# Patient Record
Sex: Female | Born: 1955 | Race: Black or African American | Hispanic: No | Marital: Married | Smoking: Never smoker
Health system: Southern US, Community
[De-identification: ages and names within clinical notes are randomized; demographics above are authoritative.]

## PROBLEM LIST (undated history)

## (undated) HISTORY — PX: TUBAL LIGATION: SHX77

---

## 2004-12-07 ENCOUNTER — Ambulatory Visit: Payer: Self-pay | Admitting: Unknown Physician Specialty

## 2005-03-09 ENCOUNTER — Ambulatory Visit: Payer: Self-pay | Admitting: Unknown Physician Specialty

## 2008-09-25 ENCOUNTER — Ambulatory Visit: Payer: Self-pay

## 2009-10-23 ENCOUNTER — Ambulatory Visit: Payer: Self-pay | Admitting: Family Medicine

## 2011-10-21 ENCOUNTER — Ambulatory Visit: Payer: Self-pay | Admitting: Family Medicine

## 2015-04-16 ENCOUNTER — Other Ambulatory Visit: Payer: Self-pay | Admitting: Family Medicine

## 2015-04-16 DIAGNOSIS — Z0000 Encounter for general adult medical examination without abnormal findings: Secondary | ICD-10-CM

## 2016-07-14 ENCOUNTER — Ambulatory Visit: Payer: Self-pay | Attending: Internal Medicine

## 2016-07-14 ENCOUNTER — Ambulatory Visit
Admission: RE | Admit: 2016-07-14 | Discharge: 2016-07-14 | Disposition: A | Discharge status: Home / Self Care | Payer: Self-pay | Source: Ambulatory Visit | Attending: Oncology | Admitting: Oncology

## 2016-07-14 VITALS — BP 134/73 | HR 75 | Temp 98.2°F | Ht 67.72 in | Wt 155.3 lb

## 2016-07-14 DIAGNOSIS — Z0000 Encounter for general adult medical examination without abnormal findings: Secondary | ICD-10-CM

## 2016-07-14 NOTE — Progress Notes (Signed)
Subjective:     Patient ID: Colleen Duarte, female   DOB: 1956-02-20, 60 y.o.   MRN: [id number]  HPI   Review of Systems     Objective:   Physical Exam  Pulmonary/Chest: Right breast exhibits no inverted nipple, no mass, no nipple discharge, no skin change and no tenderness. Left breast exhibits no inverted nipple, no mass, no nipple discharge, no skin change and no tenderness. Breasts are symmetrical.       Assessment:     60 [facility]  Patient screened, and meets [facility] eligibility.  Patient does not have insurance, Medicare or Medicaid.  Handout given on Affordable Care Act.  CBE unremarkable    Plan:     Sent for bilateral screening mammogram.

## 2016-07-28 NOTE — Progress Notes (Signed)
Letter mailed from [facility] to notify of normal mammogram results.  Patient to return in one year for annual screening.  Copy to [facility]. 

## 2019-01-05 ENCOUNTER — Encounter: Payer: Self-pay | Admitting: Podiatry

## 2019-01-05 ENCOUNTER — Ambulatory Visit (INDEPENDENT_AMBULATORY_CARE_PROVIDER_SITE_OTHER): Payer: Self-pay | Admitting: Podiatry

## 2019-01-05 ENCOUNTER — Ambulatory Visit (INDEPENDENT_AMBULATORY_CARE_PROVIDER_SITE_OTHER): Payer: Self-pay

## 2019-01-05 VITALS — BP 135/88 | HR 86

## 2019-01-05 DIAGNOSIS — M722 Plantar fascial fibromatosis: Secondary | ICD-10-CM

## 2019-01-05 MED ORDER — METHYLPREDNISOLONE 4 MG PO TBPK: ORAL_TABLET | ORAL | 0 refills | Status: AC

## 2019-01-08 ENCOUNTER — Ambulatory Visit: Payer: Self-pay

## 2019-01-08 NOTE — Progress Notes (Signed)
   Subjective: 63 year old female presenting today as a new patient with a chief complaint of intermittent pain located on the plantar left heel that began 3-4 months ago. She describes the pain as "nagging" and states it began gradually. Walking or standing for long periods of time increases the pain. She has been taking Naproxen for pain. Patient is here for further evaluation and treatment.  History reviewed. No pertinent past medical history.   Objective: Physical Exam General: The patient is alert and oriented x3 in no acute distress.  Dermatology: Skin is warm, dry and supple bilateral lower extremities. Negative for open lesions or macerations bilateral.   Vascular: Dorsalis Pedis and Posterior Tibial pulses palpable bilateral.  Capillary fill time is immediate to all digits.  Neurological: Epicritic and protective threshold intact bilateral.   Musculoskeletal: Tenderness to palpation to the plantar aspect of the left heel along the plantar fascia. All other joints range of motion within normal limits bilateral. Strength 5/5 in all groups bilateral.   Radiographic exam:   Normal osseous mineralization. Joint spaces preserved. No fracture/dislocation/boney destruction. No other soft tissue abnormalities or radiopaque foreign bodies.   Assessment: 1. Plantar fasciitis left foot  Plan of Care:  1. Patient evaluated. Xrays reviewed.   2. Injection of 0.5cc Celestone soluspan injected into the left plantar fascia.  3. Rx for Medrol Dose Pak placed. Then continue taking Naproxen.  4. Plantar fascial band(s) dispensed  5. Continue wearing New [employer] shoes.  6. Instructed patient regarding therapies and modalities at home to alleviate symptoms.  7. Return to clinic in 4 weeks.     Felecia Shelling, DPM [facility]  Dr. Felecia Shelling, DPM    [address] [place], [place] [address]                Office [phone]  Fax  [phone]

## 2019-01-30 ENCOUNTER — Ambulatory Visit: Payer: Self-pay | Admitting: Podiatry

## 2019-03-23 ENCOUNTER — Telehealth: Payer: Self-pay | Admitting: Podiatry

## 2019-03-23 ENCOUNTER — Telehealth: Payer: Self-pay | Admitting: *Deleted

## 2019-03-23 NOTE — Telephone Encounter (Signed)
I only saw her once for plantar fasciitis over 2 months ago. No pain meds will be sent in until she comes in for follow up visit. - Dr. Logan Bores

## 2019-03-23 NOTE — Telephone Encounter (Signed)
Pt's phone number showed on my call screen but she did not leave a message.

## 2019-03-23 NOTE — Telephone Encounter (Signed)
I called Colleen Duarte and she states she would like Dr. Logan Bores to call in a pain medication, he had offered but she now would like one because the naproxen is hurting her stomach.

## 2019-03-23 NOTE — Telephone Encounter (Signed)
Pain in feet and would like pain medication called in when possible

## 2019-03-26 NOTE — Telephone Encounter (Signed)
Left message informing pt of Dr. Logan Bores recommendations.

## 2019-04-25 ENCOUNTER — Ambulatory Visit: Payer: Self-pay

## 2021-04-18 ENCOUNTER — Emergency Department
Admission: EM | Admit: 2021-04-18 | Discharge: 2021-04-18 | Disposition: A | Discharge status: Home / Self Care | Payer: Self-pay | Attending: Emergency Medicine | Admitting: Emergency Medicine

## 2021-04-18 ENCOUNTER — Emergency Department: Payer: Self-pay

## 2021-04-18 ENCOUNTER — Encounter: Payer: Self-pay | Admitting: Emergency Medicine

## 2021-04-18 ENCOUNTER — Other Ambulatory Visit: Payer: Self-pay

## 2021-04-18 DIAGNOSIS — W19XXXA Unspecified fall, initial encounter: Secondary | ICD-10-CM

## 2021-04-18 DIAGNOSIS — W010XXA Fall on same level from slipping, tripping and stumbling without subsequent striking against object, initial encounter: Secondary | ICD-10-CM | POA: Insufficient documentation

## 2021-04-18 DIAGNOSIS — S42201A Unspecified fracture of upper end of right humerus, initial encounter for closed fracture: Secondary | ICD-10-CM | POA: Insufficient documentation

## 2021-04-18 DIAGNOSIS — Y92254 Theater (live) as the place of occurrence of the external cause: Secondary | ICD-10-CM | POA: Insufficient documentation

## 2021-04-18 MED ORDER — HYDROCODONE-ACETAMINOPHEN 5-325 MG PO TABS
1.0000 | ORAL_TABLET | ORAL | Status: AC
  Administered 2021-04-18: 1 via ORAL
  Filled 2021-04-18: qty 1

## 2021-04-18 MED ORDER — HYDROCODONE-ACETAMINOPHEN 5-325 MG PO TABS: 1.0000 | ORAL_TABLET | Freq: Four times a day (QID) | ORAL | 0 refills | Status: AC | PRN

## 2021-04-18 NOTE — ED Triage Notes (Signed)
Pt via POV from home. Pt had a fall this evening. Pt landed on her R shoulder. Pt is A&Ox4 and NAD. Denies head injury. Denies LOC. Denies blood thinners. Pt is A&Ox4 and NAD.

## 2021-04-18 NOTE — ED Provider Notes (Signed)
[facility] EMERGENCY [facility]PARTMENT Provider Note   CSN: [id number] Arrival date & time: 04/18/21  1832     History Chief Complaint  Patient presents with  . Fall    Colleen Duarte is a 65 y.o. female presents to the emergency department evaluation of a fall that occurred around 5 PM today.  Patient states she was in the movie theater and tripped over her feet in a dark theater.  She fell onto the right shoulder and denies hitting her head or losing consciousness.  No neck pain numbness tingling radicular symptoms.  No elbow discomfort.  She denies any lower extremity injury/pain.  She has not any medications for pain pain is moderate to severe and she points to the proximal humerus.  HPI     No past medical history on file.  There are no problems to display for this patient.   Past Surgical History:  Procedure Laterality Date  . TUBAL LIGATION       OB History   No obstetric history on file.     Family History  Problem Relation Age of Onset  . Breast cancer Sister 37       1/2 sister mother's side  . Breast cancer Paternal Aunt     Social History   Tobacco Use  . Smoking status: Never Smoker  . Smokeless tobacco: Never Used    Home Medications Prior to Admission medications   Medication Sig Start Date End Date Taking? Authorizing Provider  HYDROcodone-acetaminophen (NORCO) 5-325 MG tablet Take 1 tablet by mouth every 6 (six) hours as needed for moderate pain. 04/18/21  Yes Evon Slack, PA-C  methylPREDNISolone (MEDROL DOSEPAK) 4 MG TBPK tablet 6 day dose pack - take as directed 01/05/19   Felecia Shelling, DPM    Allergies    Patient has no known allergies.  Review of Systems   Review of Systems  Constitutional: Negative for activity change.  Eyes: Negative for pain and visual disturbance.  Respiratory: Negative for shortness of breath.   Cardiovascular: Negative for chest pain and leg swelling.  Gastrointestinal: Negative for  abdominal pain.  Genitourinary: Negative for flank pain and pelvic pain.  Musculoskeletal: Positive for arthralgias. Negative for gait problem, joint swelling, myalgias, neck pain and neck stiffness.  Skin: Negative for wound.  Neurological: Negative for dizziness, syncope, weakness, light-headedness, numbness and headaches.  Psychiatric/Behavioral: Negative for confusion and decreased concentration.    Physical Exam Updated Vital Signs BP (!) 160/60 (BP Location: Left Arm)   Pulse 69   Temp 98 F (36.7 C)   Resp 20   Ht 5\' 6"  (1.676 m)   Wt 64.9 kg   LMP 07/14/2006 (Approximate)   SpO2 100%   BMI 23.08 kg/m   Physical Exam Constitutional:      Appearance: She is well-developed.  HENT:     Head: Normocephalic and atraumatic.     Right Ear: External ear normal.     Left Ear: External ear normal.     Nose: Nose normal.  Eyes:     Extraocular Movements: Extraocular movements intact.     Conjunctiva/sclera: Conjunctivae normal.     Pupils: Pupils are equal, round, and reactive to light.  Cardiovascular:     Rate and Rhythm: Normal rate.  Pulmonary:     Effort: Pulmonary effort is normal. No respiratory distress.     Breath sounds: Normal breath sounds.  Abdominal:     Palpations: Abdomen is soft.  Tenderness: There is no abdominal tenderness.  Musculoskeletal:        General: Tenderness present. No deformity. Normal range of motion.     Cervical back: Normal range of motion.     Comments: Positive tenderness to the right proximal humerus.  No cervical spinous process tenderness or paravertebral muscle tenderness.  She has full active range of motion of cervical spine with no discomfort.  She has no tenderness throughout the clavicles, sternum.  She has no tenderness along the hips knees or ankles.  Skin:    General: Skin is warm and dry.     Findings: No rash.  Neurological:     Mental Status: She is alert and oriented to person, place, and time.     Cranial Nerves:  No cranial nerve deficit.     Coordination: Coordination normal.  Psychiatric:        Mood and Affect: Mood normal.        Behavior: Behavior normal.        Thought Content: Thought content normal.     ED Results / Procedures / Treatments   Labs (all labs ordered are listed, but only abnormal results are displayed) Labs Reviewed - No data to display  EKG None  Radiology DG Shoulder Right  Result Date: 04/18/2021 CLINICAL DATA:  Recent fall with right shoulder pain, initial encounter EXAM: RIGHT SHOULDER - 2+ VIEW COMPARISON:  None. FINDINGS: Degenerative changes of the acromioclavicular joint are noted. There is a fracture through the surgical neck with impaction at the fracture site. Mild inferior subluxation of the humeral head is noted. Underlying bony thorax appears within normal limits. IMPRESSION: Surgical neck fracture in the right proximal humerus with impaction at the fracture site. Electronically Signed   By: Alcide Clever M.D.   On: 04/18/2021 19:08    Procedures Procedures   Medications Ordered in ED Medications  HYDROcodone-acetaminophen (NORCO/VICODIN) 5-325 MG per tablet 1 tablet (has no administration in time range)    ED Course  I have reviewed the triage vital signs and the nursing notes.  Pertinent labs & imaging results that were available during my care of the patient were reviewed by me and considered in my medical decision making (see chart for details).    MDM Rules/Calculators/A&P                          65 year old female with right proximal humerus fracture.  No other injury to her body.  Vital signs are stable.  She is ambulatory.  Patient placed into a shoulder immobilizer and given ice.  She will be prescribed Norco for pain.  She will follow-up with orthopedics in 3 days.  She understands signs symptoms return to the ER for. Final Clinical Impression(s) / ED Diagnoses Final diagnoses:  Fall, initial encounter  Traumatic closed displaced  fracture of proximal end of right humerus, initial encounter    Rx / DC Orders ED Discharge Orders         Ordered    HYDROcodone-acetaminophen (NORCO) 5-325 MG tablet  Every 6 hours PRN        04/18/21 2018           Ronnette Juniper 04/18/21 2028    Minna Antis, MD 04/19/21 0004

## 2021-04-18 NOTE — Discharge Instructions (Signed)
Please wear shoulder immobilizer at all times.  Okay to remove to shower and reapply following the shower.  Take Norco as needed for moderate to severe pain.  You may use Tylenol for mild pain.  Apply ice 20 minutes every hour to the right shoulder to help with pain and swelling.  Call orthopedic office Tuesday morning to schedule follow-up appointment.

## 2022-01-05 IMAGING — CR DG SHOULDER 2+V*R*
2 series · 2 of 2 positions shown · non-contrast
Comparison: None.

CLINICAL DATA: Recent fall with right shoulder pain, initial
encounter

EXAM:
RIGHT SHOULDER - 2+ VIEW

[shoulder grashey]
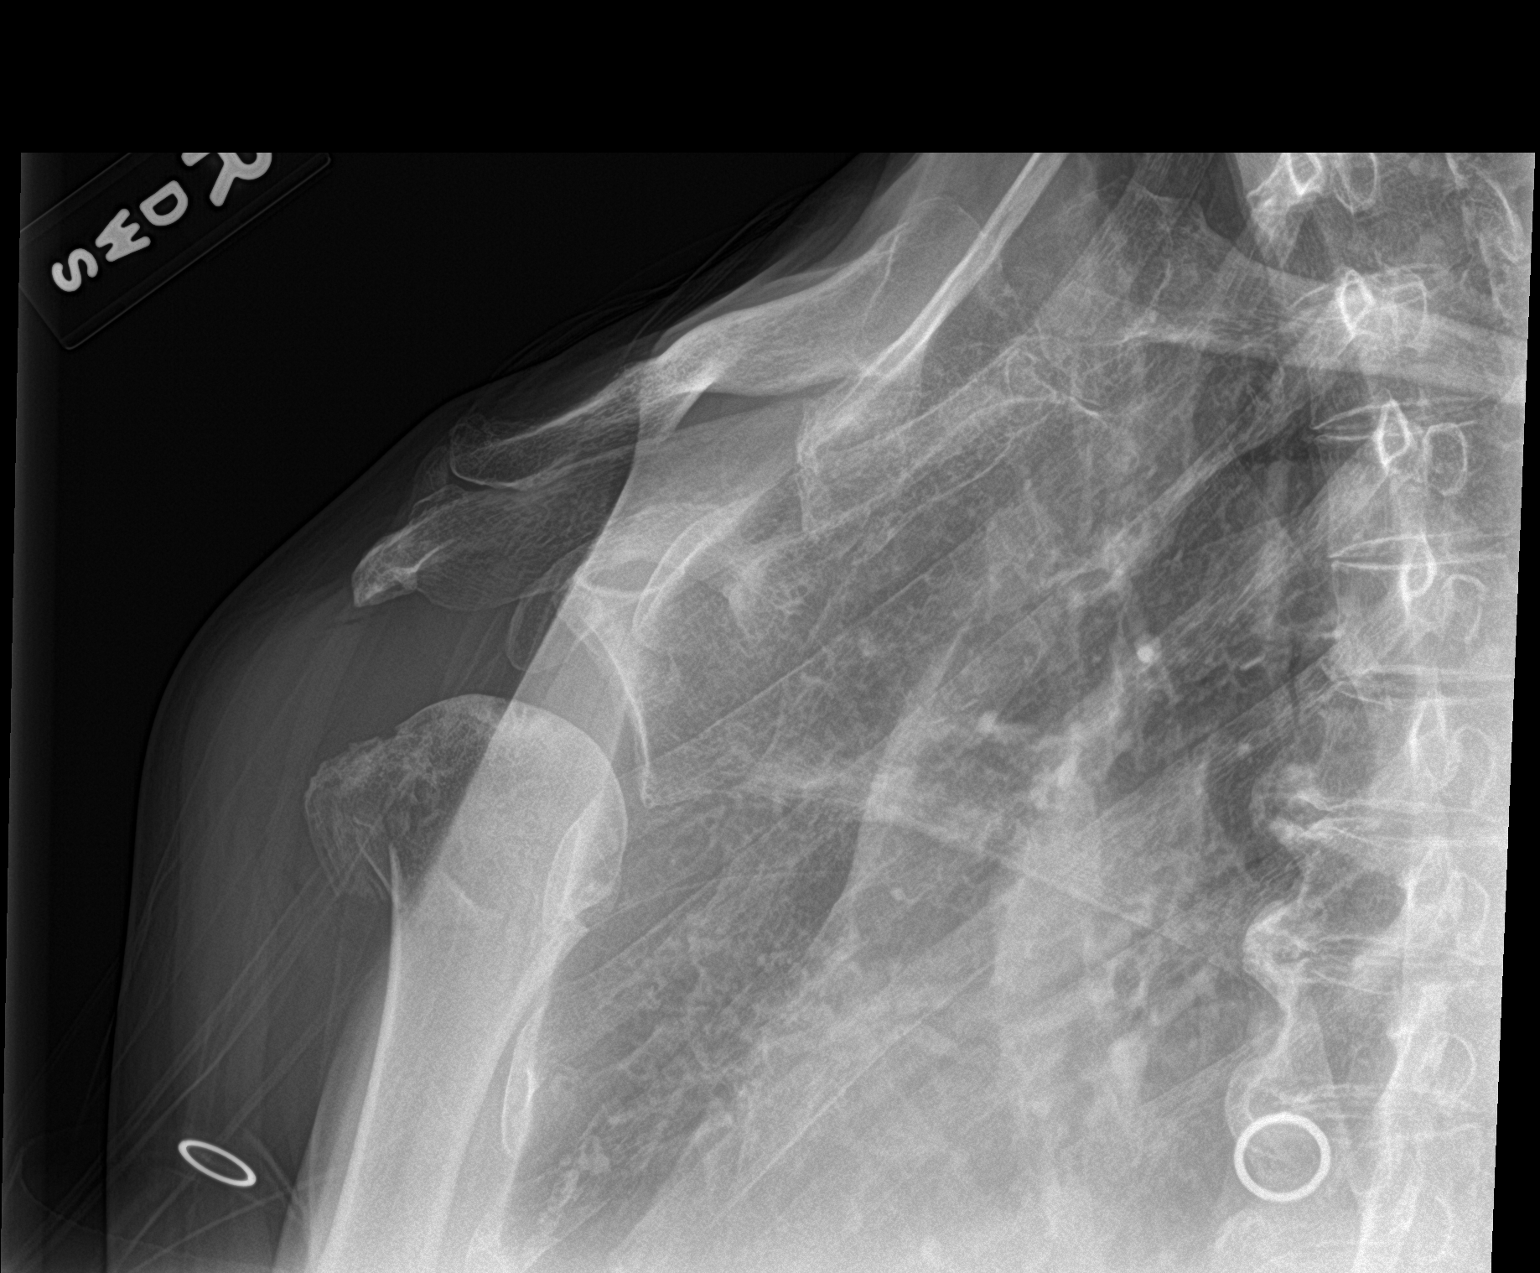

[shoulder y view]
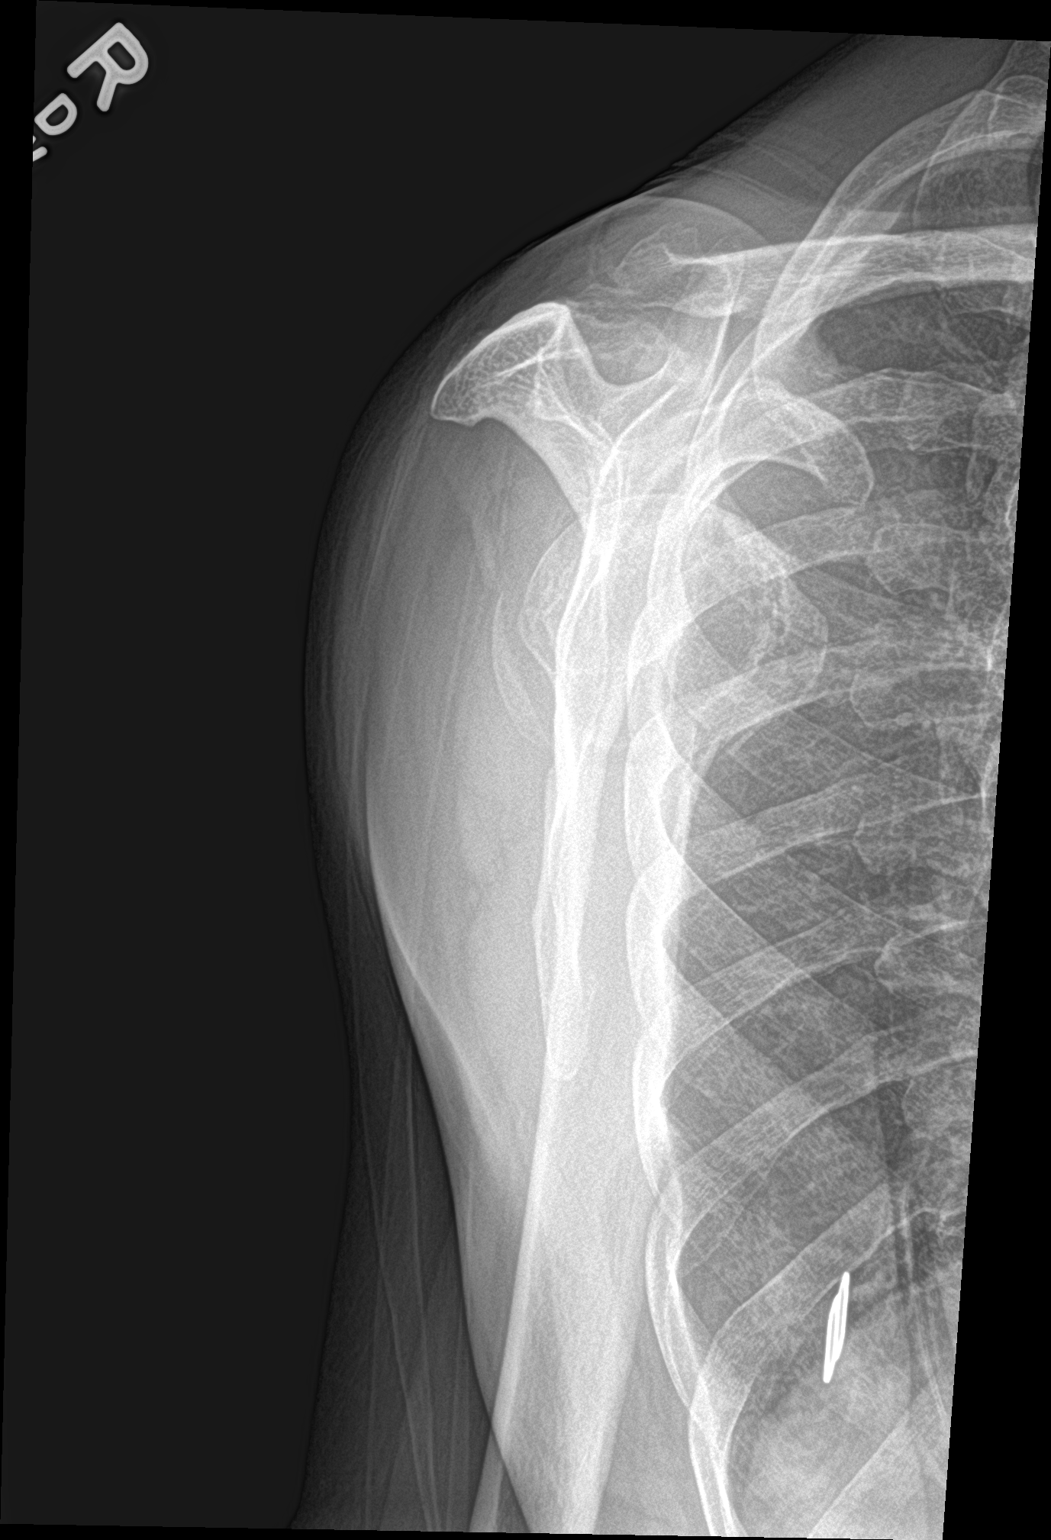

[2 of 2 positions shown; findings below may reference images not displayed]

FINDINGS: Degenerative changes of the acromioclavicular joint are noted. There
is a fracture through the surgical neck with impaction at the
fracture site. Mild inferior subluxation of the humeral head is
noted. Underlying bony thorax appears within normal limits.
IMPRESSION: Surgical neck fracture in the right proximal humerus with impaction
at the fracture site.

## 2024-03-09 ENCOUNTER — Encounter: Payer: Self-pay | Admitting: Family Medicine
